# Patient Record
Sex: Male | Born: 1990 | Race: Black or African American | Hispanic: No | Marital: Single | Smoking: Current every day smoker
Health system: Southern US, Community
[De-identification: ages and names within clinical notes are randomized; demographics above are authoritative.]

## PROBLEM LIST (undated history)

## (undated) HISTORY — PX: TONSILLECTOMY: SUR1361

---
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
## 2013-07-11 ENCOUNTER — Emergency Department (HOSPITAL_COMMUNITY): Payer: Federal, State, Local not specified - PPO

## 2013-07-11 ENCOUNTER — Encounter (HOSPITAL_COMMUNITY): Payer: Self-pay | Admitting: Emergency Medicine

## 2013-07-11 ENCOUNTER — Emergency Department (HOSPITAL_COMMUNITY)
Admission: EM | Admit: 2013-07-11 | Discharge: 2013-07-11 | Disposition: A | Discharge status: Home / Self Care | Payer: Federal, State, Local not specified - PPO | Attending: Emergency Medicine | Admitting: Emergency Medicine

## 2013-07-11 DIAGNOSIS — W2209XA Striking against other stationary object, initial encounter: Secondary | ICD-10-CM | POA: Insufficient documentation

## 2013-07-11 DIAGNOSIS — Y9389 Activity, other specified: Secondary | ICD-10-CM | POA: Insufficient documentation

## 2013-07-11 DIAGNOSIS — F17200 Nicotine dependence, unspecified, uncomplicated: Secondary | ICD-10-CM | POA: Insufficient documentation

## 2013-07-11 DIAGNOSIS — Y929 Unspecified place or not applicable: Secondary | ICD-10-CM | POA: Insufficient documentation

## 2013-07-11 DIAGNOSIS — S6990XA Unspecified injury of unspecified wrist, hand and finger(s), initial encounter: Secondary | ICD-10-CM | POA: Insufficient documentation

## 2013-07-11 DIAGNOSIS — M79643 Pain in unspecified hand: Secondary | ICD-10-CM

## 2013-07-11 MED ORDER — IBUPROFEN 800 MG PO TABS: 800.0000 mg | ORAL_TABLET | Freq: Three times a day (TID) | ORAL | Status: DC | PRN

## 2013-07-11 NOTE — ED Notes (Signed)
Pt presents with c/o right hand pain. Pt says that he punched a wall approx 7 days ago, was not seen at that time. Pt says that his hand has been hurting since that time.

## 2013-07-11 NOTE — Discharge Instructions (Signed)
Follow up with your doctor. Ice and elevate the hand

## 2013-07-11 NOTE — ED Provider Notes (Signed)
CSN: [id number]     Arrival date & time 07/11/13  2143 History   First MD Initiated Contact with Patient 07/11/13 2237    This chart was scribed for non-physician practitioner, Ebbie Ridge, PA, working with Hurman Horn, MD by Marica Otter, ED Scribe. This patient was seen in room WTR9/WTR9 and the patient's care was started at 10:45 PM.  Chief Complaint  Patient presents with  . Hand Pain   HPI HPI Comments: [facility] is a 23 y.o. male who presents to the Emergency Department complaining of intermittent right hand pain onset one week ago when pt punched a wall. Pt reports he experiences right hand pain when he makes a fist only.   History reviewed. No pertinent past medical history. Past Surgical History  Procedure Laterality Date  . Tonsillectomy     No family history on file. History  Substance Use Topics  . Smoking status: Current Every Day Smoker  . Smokeless tobacco: Not on file  . Alcohol Use: Yes     Comment: occasionally     Review of Systems  A complete 10 system review of systems was obtained and all systems are negative except as noted in the HPI and PMH.    Allergies  Review of patient's allergies indicates no known allergies.  Home Medications   Prior to Admission medications   Not on File   Triage Vitals: BP 159/59  Pulse 64  Temp(Src) 98.3 F (36.8 C) (Oral)  Resp 18  SpO2 100% Physical Exam  Constitutional: He is oriented to person, place, and time. He appears well-developed and well-nourished.  HENT:  Head: Normocephalic and atraumatic.  Pulmonary/Chest: Effort normal.  Musculoskeletal:       Hands: Neurological: He is alert and oriented to person, place, and time.  Skin: Skin is warm and dry.    ED Course  Procedures (including critical care time) DIAGNOSTIC STUDIES: Oxygen Saturation is 100% on RA, normal by my interpretation.    COORDINATION OF CARE:  10:46 PM-Discussed treatment plan which includes imaging with pt. Patient  verbalizes understanding and agrees with treatment plan.  Imaging Review Dg Hand Complete Right  07/11/2013   CLINICAL DATA:  Right hand pain.  History of trauma.  EXAM: RIGHT HAND - COMPLETE 3+ VIEW  COMPARISON:  None.  FINDINGS: The joint spaces are maintained.  No acute fracture.  IMPRESSION: No acute bony findings.   Electronically Signed   By: Loralie Champagne M.D.   On: 07/11/2013 22:33    Patient is advised return here as needed.  The patient, states he only has pain when he makes a fist.  Patient is advised to use ice and elevation to the hand   I personally performed the services described in this documentation, which was scribed in my presence. The recorded information has been reviewed and is accurate.   Carlyle Dolly, PA-C 07/12/13 [id number]

## 2013-07-12 NOTE — ED Provider Notes (Signed)
Medical screening examination/treatment/procedure(s) were performed by non-physician practitioner and as supervising physician I was immediately available for consultation/collaboration.   EKG Interpretation None       Helayna Dun M Jakyah Bradby, MD 07/12/13 1410 

## 2013-12-16 ENCOUNTER — Ambulatory Visit (INDEPENDENT_AMBULATORY_CARE_PROVIDER_SITE_OTHER): Payer: Federal, State, Local not specified - PPO | Admitting: Emergency Medicine

## 2013-12-16 VITALS — BP 114/60 | HR 91 | Temp 98.6°F | Resp 18 | Ht 72.0 in | Wt 154.0 lb

## 2013-12-16 DIAGNOSIS — J0100 Acute maxillary sinusitis, unspecified: Secondary | ICD-10-CM

## 2013-12-16 DIAGNOSIS — J209 Acute bronchitis, unspecified: Secondary | ICD-10-CM

## 2013-12-16 MED ORDER — PROMETHAZINE-CODEINE 6.25-10 MG/5ML PO SYRP: 5.0000 mL | ORAL_SOLUTION | Freq: Four times a day (QID) | ORAL | Status: DC | PRN

## 2013-12-16 MED ORDER — PSEUDOEPHEDRINE-GUAIFENESIN ER 60-600 MG PO TB12: 1.0000 | ORAL_TABLET | Freq: Two times a day (BID) | ORAL | Status: AC

## 2013-12-16 MED ORDER — AMOXICILLIN-POT CLAVULANATE 875-125 MG PO TABS: 1.0000 | ORAL_TABLET | Freq: Two times a day (BID) | ORAL | Status: DC

## 2013-12-16 NOTE — Progress Notes (Signed)
[facility] [address], [place] [address] [phone]  Date:  12/16/2013   Name:  [facility]   DOB:  01-Feb-1991   MRN:  [id number]  PCP:  Nelwyn SalisburyFRY,STEPHEN A, MD    Chief Complaint: Cough; Nasal Congestion; and Fatigue   History of Present Illness:  [facility] is a 23 y.o. very pleasant male patient who presents with the following:  Ill since three days ago with purulent nasal drainage and post nasal drip.  Sore throat Cough productive purulent sputum.  No wheezing or shortness of breath. No nausea or vomiting. No stool change or rash Fever yesterday no chills No improvement with over the counter medications or other home remedies.  Denies other complaint or health concern today.   Patient Active Problem List   Diagnosis Date Noted  . ACUTE BRONCHITIS 10/23/2007  . ADHD 03/31/2007  . VIRAL URI 02/13/2007    History reviewed. No pertinent past medical history.  Past Surgical History  Procedure Laterality Date  . Tonsillectomy      History  Substance Use Topics  . Smoking status: Current Every Day Smoker  . Smokeless tobacco: Not on file  . Alcohol Use: Yes     Comment: occasionally     Family History  Problem Relation Age of Onset  . Hypertension Maternal Grandmother   . Stroke Maternal Grandmother     No Known Allergies  Medication list has been reviewed and updated.  No current outpatient prescriptions on file prior to visit.   No current facility-administered medications on file prior to visit.    Review of Systems:  As per HPI, otherwise negative.    Physical Examination: Filed Vitals:   12/16/13 1448  BP: 114/60  Pulse: 91  Temp: 98.6 F (37 C)  Resp: 18   Filed Vitals:   12/16/13 1448  Height: 6' (1.829 m)  Weight: 154 lb (69.854 kg)   Body mass index is 20.88 kg/(m^2). Ideal Body Weight: Weight in (lb) to have BMI = 25: 183.9  GEN: WDWN, NAD, Non-toxic, A & O x 3 HEENT: Atraumatic,  Normocephalic. Neck supple. No masses, No LAD. Ears and Nose: No external deformity. CV: RRR, No M/G/R. No JVD. No thrill. No extra heart sounds. PULM: CTA B, no wheezes, crackles, rhonchi. No retractions. No resp. distress. No accessory muscle use. ABD: S, NT, ND, +BS. No rebound. No HSM. EXTR: No c/c/e NEURO Normal gait.  PSYCH: Normally interactive. Conversant. Not depressed or anxious appearing.  Calm demeanor.    Assessment and Plan: Sinusitis Bronchitis augmentin mucinex d Phen c cod  Signed,  Phillips OdorJeffery Davan Nawabi, MD

## 2013-12-16 NOTE — Patient Instructions (Signed)

## 2014-08-10 ENCOUNTER — Encounter (HOSPITAL_COMMUNITY): Payer: Self-pay | Admitting: Emergency Medicine

## 2014-08-10 ENCOUNTER — Emergency Department (HOSPITAL_COMMUNITY)
Admission: EM | Admit: 2014-08-10 | Discharge: 2014-08-11 | Disposition: A | Discharge status: Home / Self Care | Payer: Federal, State, Local not specified - PPO | Attending: Emergency Medicine | Admitting: Emergency Medicine

## 2014-08-10 DIAGNOSIS — Z792 Long term (current) use of antibiotics: Secondary | ICD-10-CM | POA: Insufficient documentation

## 2014-08-10 DIAGNOSIS — K029 Dental caries, unspecified: Secondary | ICD-10-CM | POA: Insufficient documentation

## 2014-08-10 DIAGNOSIS — K088 Other specified disorders of teeth and supporting structures: Secondary | ICD-10-CM | POA: Diagnosis present

## 2014-08-10 DIAGNOSIS — Z720 Tobacco use: Secondary | ICD-10-CM | POA: Diagnosis not present

## 2014-08-10 DIAGNOSIS — K0889 Other specified disorders of teeth and supporting structures: Secondary | ICD-10-CM

## 2014-08-10 MED ORDER — AMOXICILLIN 500 MG PO CAPS: 500.0000 mg | ORAL_CAPSULE | Freq: Three times a day (TID) | ORAL | Status: DC

## 2014-08-10 MED ORDER — IBUPROFEN 600 MG PO TABS: 600.0000 mg | ORAL_TABLET | Freq: Four times a day (QID) | ORAL | Status: DC | PRN

## 2014-08-10 NOTE — Discharge Instructions (Signed)

## 2014-08-10 NOTE — ED Notes (Signed)
Pt states he has had R upper dental pain x 4-5 days. Alert and oriented.

## 2014-08-10 NOTE — ED Provider Notes (Signed)
CSN: [id number]     Arrival date & time 08/10/14  2239 History   This chart was scribed for [facility], [facility], working with Layla Maw Ward, DO by Octavia Heir, ED Scribe. This patient was seen in room WTR6/WTR6 and the patient's care was started at 11:45 PM.     Chief Complaint  Patient presents with  . Dental Pain    The history is provided by the patient. No language interpreter was used.   HPI Comments: [facility] is a 24 y.o. male who presents to the Emergency Department complaining of right sided dental pain onset 4-5 days. He notes his pain as a throbbing sensation. Pt states has a whole in his tooth that he noticed a few months ago. He reports "sleep or liquor" makes his pain better. Pt notes he has been drinking etoh to alleviate the pain with relief. He denies fever and injury to mouth. He has no known drug allergies.  History reviewed. No pertinent past medical history. Past Surgical History  Procedure Laterality Date  . Tonsillectomy     Family History  Problem Relation Age of Onset  . Hypertension Maternal Grandmother   . Stroke Maternal Grandmother    History  Substance Use Topics  . Smoking status: Current Every Day Smoker  . Smokeless tobacco: Not on file  . Alcohol Use: Yes     Comment: occasionally     Review of Systems  HENT: Positive for dental problem.   All other systems reviewed and are negative.   Allergies  Review of patient's allergies indicates no known allergies.  Home Medications   Prior to Admission medications   Medication Sig Start Date End Date Taking? Authorizing Provider  amoxicillin (AMOXIL) 500 MG capsule Take 1 capsule (500 mg total) by mouth 3 (three) times daily. 08/10/14   [facility], [facility]  amoxicillin-clavulanate (AUGMENTIN) 875-125 MG per tablet Take 1 tablet by mouth 2 (two) times daily. 12/16/13   Carmelina Dane, MD  ibuprofen (ADVIL,MOTRIN) 600 MG tablet Take 1 tablet (600 mg total) by mouth every 6 (six) hours as  needed. 08/10/14   [facility], [facility]  promethazine-codeine (PHENERGAN WITH CODEINE) 6.25-10 MG/5ML syrup Take 5-10 mLs by mouth every 6 (six) hours as needed. 12/16/13   Carmelina Dane, MD  pseudoephedrine-guaifenesin (MUCINEX D) 60-600 MG per tablet Take 1 tablet by mouth every 12 (twelve) hours. 12/16/13 12/16/14  Carmelina Dane, MD   Triage vitals: BP 147/77 mmHg  Pulse 69  Temp(Src) 99 F (37.2 C) (Oral)  Resp 18  SpO2 100%  Physical Exam  Constitutional: He is oriented to person, place, and time. He appears well-developed and well-nourished. No distress.  Nontoxic/nonseptic appearing  HENT:  Head: Normocephalic and atraumatic.  Mouth/Throat: Uvula is midline, oropharynx is clear and moist and mucous membranes are normal. No oral lesions. No trismus in the jaw. Abnormal dentition. Dental caries present. No dental abscesses or uvula swelling.    Right upper 2nd molar TTP. Associated dental caries. No gingival swelling or fluctuance. No trismus. Patient tolerating secretions without difficulty. Uvula midline.  Eyes: Conjunctivae and EOM are normal. No scleral icterus.  Neck: Normal range of motion.  No nuchal rigidity or meningismus  Pulmonary/Chest: Effort normal. No respiratory distress.  Musculoskeletal: Normal range of motion.  Neurological: He is alert and oriented to person, place, and time. He exhibits normal muscle tone. Coordination normal.  Skin: Skin is warm and dry. No rash noted. He is not diaphoretic. No erythema. No  pallor.  Psychiatric: He has a normal mood and affect. His behavior is normal.  Nursing note and vitals reviewed.   ED Course  Procedures  DIAGNOSTIC STUDIES: Oxygen Saturation is 100% on RA, normal by my interpretation.  COORDINATION OF CARE:  11:48 PM Discussed treatment plan which includes antibiotics, NSAIDs, call dentist in the morning with pt at bedside and pt agreed to plan.  Labs Review Labs Reviewed - No data to display  Imaging  Review No results found.   EKG Interpretation None      MDM   Final diagnoses:  Dentalgia    Patient with toothache. No gross abscess. Exam unconcerning for Ludwig's angina or spread of infection. Will treat with Amoxicillin and NSAIDs. Urged patient to follow-up with dentist. Referral and resource guide provided. Patient discharged in good condition with no unaddressed concerns.  I personally performed the services described in this documentation, which was scribed in my presence. The recorded information has been reviewed and is accurate.   Filed Vitals:   08/10/14 2305  BP: 147/77  Pulse: 69  Temp: 99 F (37.2 C)  TempSrc: Oral  Resp: 18  SpO2: 100%      Vincent MaduraKelly Malayah Demuro, [facility] 08/11/14 0034  Layla MawKristen N Ward, DO 08/11/14 [id number]

## 2015-09-11 IMAGING — CR DG HAND COMPLETE 3+V*R*
3 series · 3 of 3 positions shown · non-contrast
Comparison: None.

CLINICAL DATA: Right hand pain.  History of trauma.

EXAM:
RIGHT HAND - COMPLETE 3+ VIEW

[x hand pa right]
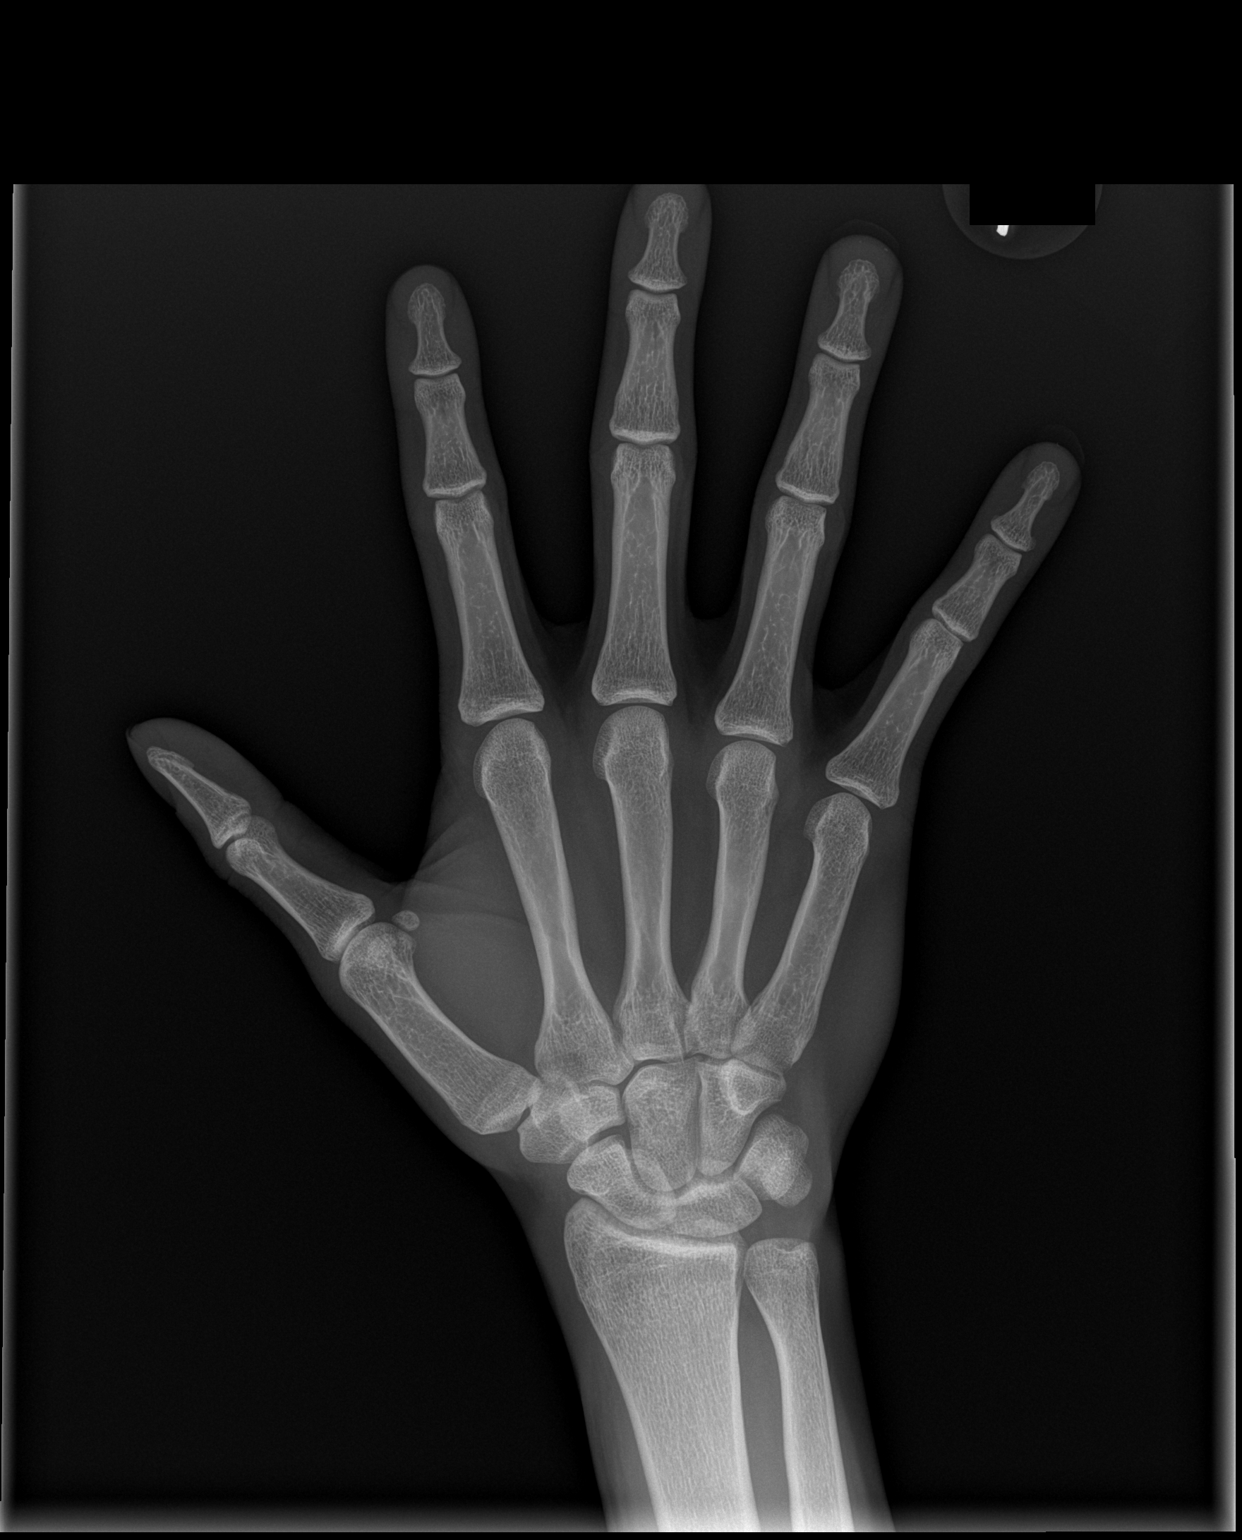

[x hand obl right]
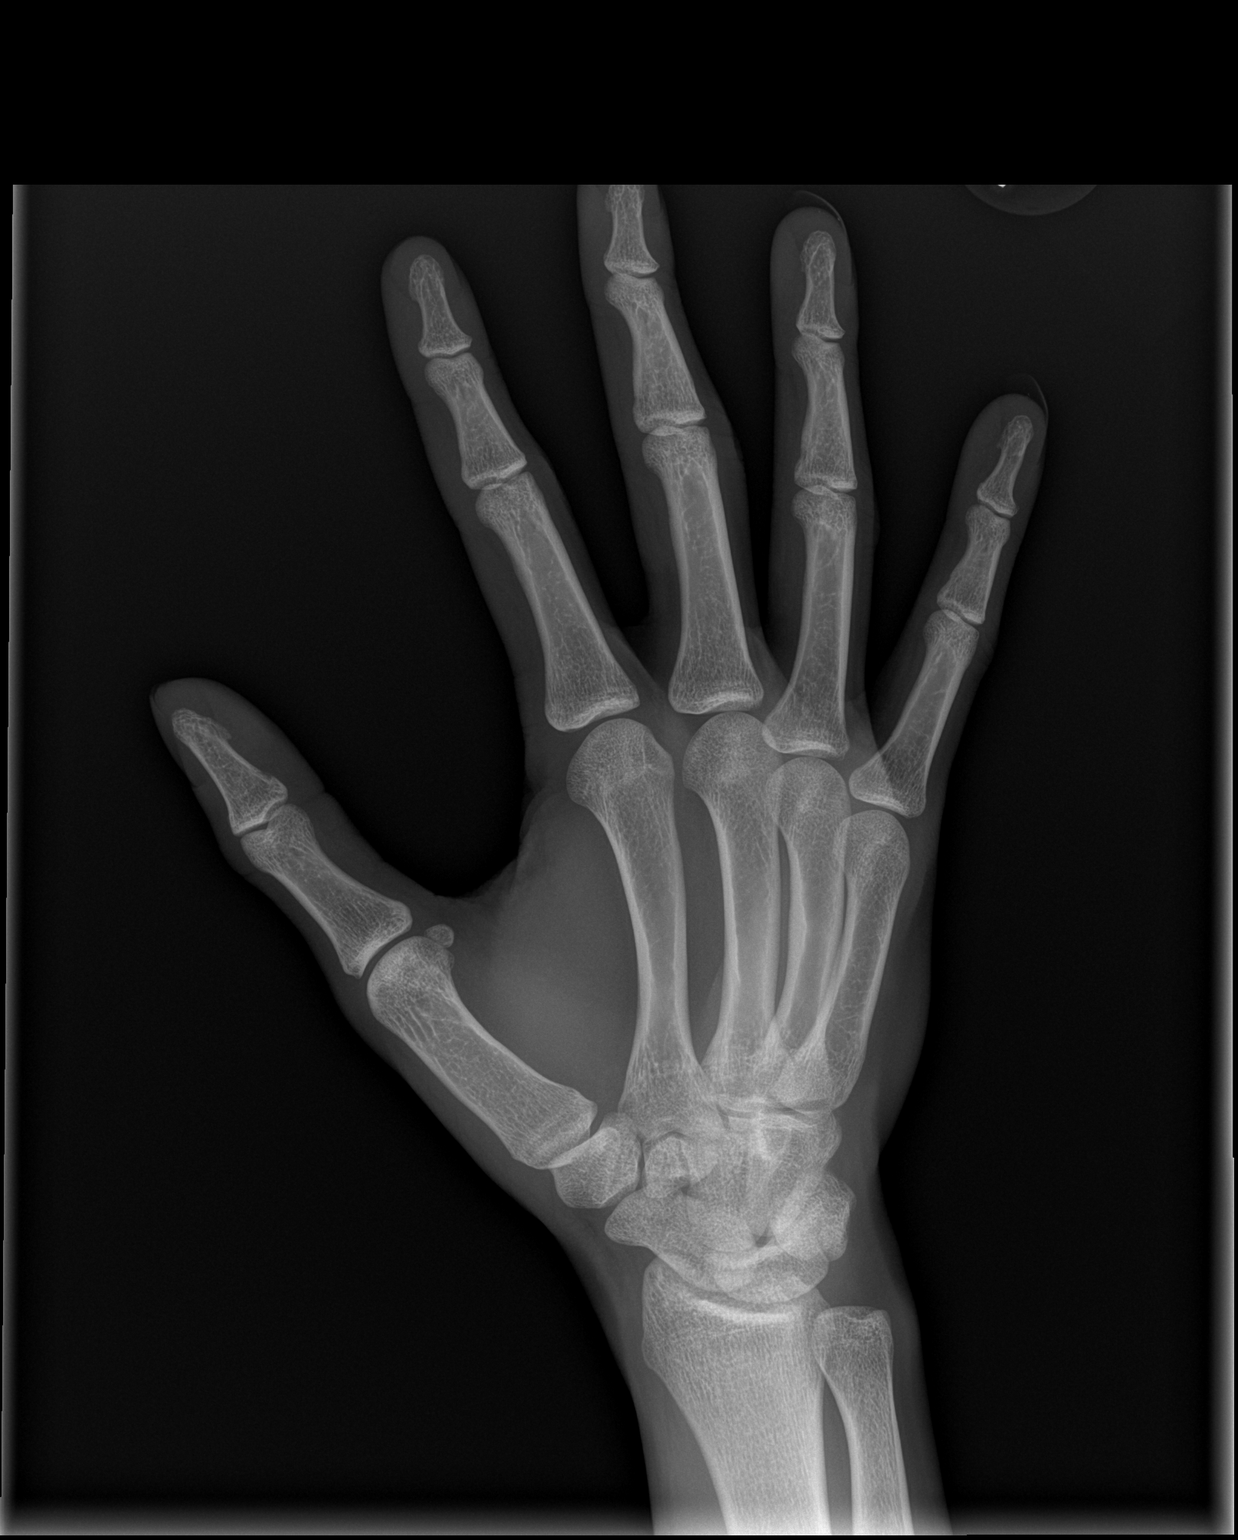

[x hand lat right]
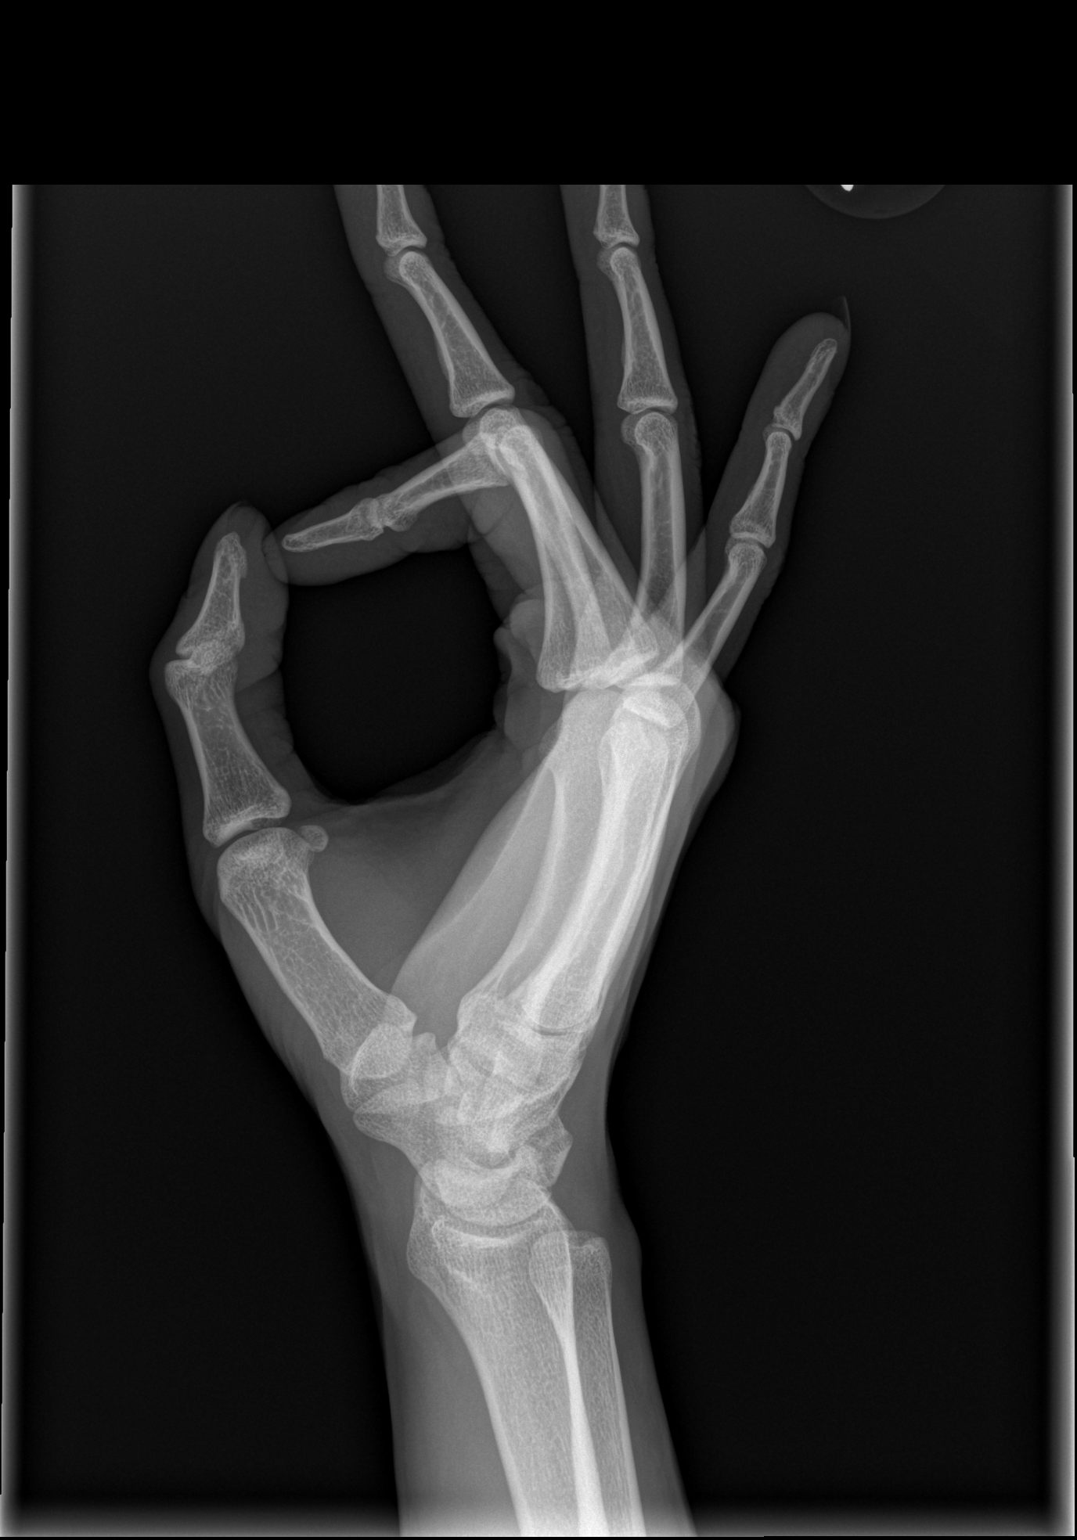

[3 of 3 positions shown; findings below may reference images not displayed]

FINDINGS: The joint spaces are maintained.  No acute fracture.
IMPRESSION: No acute bony findings.

## 2017-10-14 ENCOUNTER — Other Ambulatory Visit: Payer: Self-pay

## 2017-10-14 ENCOUNTER — Encounter (HOSPITAL_COMMUNITY): Payer: Self-pay | Admitting: Emergency Medicine

## 2017-10-14 ENCOUNTER — Ambulatory Visit (HOSPITAL_COMMUNITY)
Admission: EM | Admit: 2017-10-14 | Discharge: 2017-10-14 | Disposition: A | Discharge status: Home / Self Care | Payer: Federal, State, Local not specified - PPO | Attending: Family Medicine | Admitting: Family Medicine

## 2017-10-14 DIAGNOSIS — K029 Dental caries, unspecified: Secondary | ICD-10-CM | POA: Diagnosis not present

## 2017-10-14 MED ORDER — AMOXICILLIN 500 MG PO CAPS: 500.0000 mg | ORAL_CAPSULE | Freq: Three times a day (TID) | ORAL | 0 refills | Status: AC

## 2017-10-14 MED ORDER — IBUPROFEN 800 MG PO TABS: 800.0000 mg | ORAL_TABLET | Freq: Three times a day (TID) | ORAL | 0 refills | Status: AC | PRN

## 2017-10-14 NOTE — ED Provider Notes (Signed)
[facility]    CSN: [id number] Arrival date & time: 10/14/17  1834     History   Chief Complaint Chief Complaint  Patient presents with  . Dental Pain    HPI [facility] is a 27 y.o. male.   HPI  he has a fractured molar on the right upper jaw.  It is very painful.  No drainage.  No foul odor.  He has not seen a dentist.  He feels he cannot afford to.  History reviewed. No pertinent past medical history.  Patient Active Problem List   Diagnosis Date Noted  . ACUTE BRONCHITIS 10/23/2007  . ADHD 03/31/2007  . VIRAL URI 02/13/2007    Past Surgical History:  Procedure Laterality Date  . TONSILLECTOMY         Home Medications    Prior to Admission medications   Medication Sig Start Date End Date Taking? Authorizing Provider  amoxicillin (AMOXIL) 500 MG capsule Take 1 capsule (500 mg total) by mouth 3 (three) times daily. 10/14/17   Eustace Moore, MD  ibuprofen (ADVIL,MOTRIN) 800 MG tablet Take 1 tablet (800 mg total) by mouth every 8 (eight) hours as needed for moderate pain. 10/14/17   Eustace Moore, MD    Family History Family History  Problem Relation Age of Onset  . Hypertension Maternal Grandmother   . Stroke Maternal Grandmother     Social History Social History   Tobacco Use  . Smoking status: Current Every Day Smoker  Substance Use Topics  . Alcohol use: Yes    Comment: occasionally   . Drug use: No     Allergies   Patient has no known allergies.   Review of Systems Review of Systems   Physical Exam Triage Vital Signs ED Triage Vitals  Enc Vitals Group     BP 10/14/17 1934 (!) 147/71     Pulse Rate 10/14/17 1934 72     Resp 10/14/17 1934 18     Temp 10/14/17 1934 98.6 F (37 C)     Temp Source 10/14/17 1934 Oral     SpO2 10/14/17 1934 100 %     Weight --      Height --      Head Circumference --      Peak Flow --      Pain Score 10/14/17 1935 6     Pain Loc --      Pain Edu? --      Excl. in GC? --     No data found.  Updated Vital Signs BP (!) 147/71 (BP Location: Left Arm)   Pulse 72   Temp 98.6 F (37 C) (Oral)   Resp 18   SpO2 100%       Physical Exam  Constitutional: He appears well-developed and well-nourished. No distress.  HENT:  Head: Normocephalic and atraumatic.  Mouth/Throat: Oropharynx is clear and moist.    Eyes: Pupils are equal, round, and reactive to light. Conjunctivae are normal.  Neck: Normal range of motion.  Cardiovascular: Normal rate.  Pulmonary/Chest: Effort normal. No respiratory distress.  Abdominal: Soft. He exhibits no distension.  Musculoskeletal: Normal range of motion. He exhibits no edema.  Lymphadenopathy:    He has cervical adenopathy.  Neurological: He is alert.  Skin: Skin is warm and dry.     UC Treatments / Results  Labs (all labs ordered are listed, but only abnormal results are displayed) Labs Reviewed - No data to display  EKG None  Radiology No results found.  Procedures Procedures (including critical care time)  Medications Ordered in UC Medications - No data to display  Initial Impression / Assessment and Plan / UC Course  I have reviewed the triage vital signs and the nursing notes.  Pertinent labs & imaging results that were available during my care of the patient were reviewed by me and considered in my medical decision making (see chart for details).    Discussed need for dentistry. Final Clinical Impressions(s) / UC Diagnoses   Final diagnoses:  Dental caries     Discharge Instructions     Antibiotic as prescribed Take ibuprofen as needed for pain Call the dentist   ED Prescriptions    Medication Sig Dispense Auth. Provider   amoxicillin (AMOXIL) 500 MG capsule Take 1 capsule (500 mg total) by mouth 3 (three) times daily. 30 capsule Eustace Moore, MD   ibuprofen (ADVIL,MOTRIN) 800 MG tablet Take 1 tablet (800 mg total) by mouth every 8 (eight) hours as needed for moderate pain. 90  tablet Eustace Moore, MD     Controlled Substance Prescriptions [place] Controlled Substance Registry consulted? Not Applicable   Eustace Moore, MD 10/14/17 2147

## 2017-10-14 NOTE — Discharge Instructions (Signed)
Antibiotic as prescribed Take ibuprofen as needed for pain Call the dentist

## 2017-10-14 NOTE — ED Triage Notes (Signed)
Patient has toothache.  Pain is top right.    Patient asking about cholesterol checks.
